# Patient Record
Sex: Female | Born: 1945 | Race: White | Hispanic: No | Marital: Married
Health system: Southern US, Community
[De-identification: ages and names within clinical notes are randomized; demographics above are authoritative.]

---

## 1997-12-29 ENCOUNTER — Other Ambulatory Visit: Admission: RE | Admit: 1997-12-29 | Discharge: 1997-12-29 | Payer: Self-pay | Admitting: Obstetrics and Gynecology

## 1998-09-03 ENCOUNTER — Other Ambulatory Visit: Admission: RE | Admit: 1998-09-03 | Discharge: 1998-09-03 | Payer: Self-pay | Admitting: *Deleted

## 1999-08-23 ENCOUNTER — Encounter: Payer: Self-pay | Admitting: Obstetrics and Gynecology

## 1999-08-23 ENCOUNTER — Encounter: Admission: RE | Admit: 1999-08-23 | Discharge: 1999-08-23 | Payer: Self-pay | Admitting: Obstetrics and Gynecology

## 2000-08-24 ENCOUNTER — Encounter: Admission: RE | Admit: 2000-08-24 | Discharge: 2000-08-24 | Payer: Self-pay | Admitting: Obstetrics and Gynecology

## 2000-08-24 ENCOUNTER — Encounter: Payer: Self-pay | Admitting: Obstetrics and Gynecology

## 2001-08-25 ENCOUNTER — Encounter: Payer: Self-pay | Admitting: Obstetrics and Gynecology

## 2001-08-25 ENCOUNTER — Encounter: Admission: RE | Admit: 2001-08-25 | Discharge: 2001-08-25 | Payer: Self-pay | Admitting: Obstetrics and Gynecology

## 2002-08-26 ENCOUNTER — Encounter: Admission: RE | Admit: 2002-08-26 | Discharge: 2002-08-26 | Payer: Self-pay | Admitting: Obstetrics and Gynecology

## 2002-08-26 ENCOUNTER — Encounter: Payer: Self-pay | Admitting: Obstetrics and Gynecology

## 2003-09-01 ENCOUNTER — Encounter: Admission: RE | Admit: 2003-09-01 | Discharge: 2003-09-01 | Payer: Self-pay | Admitting: Obstetrics and Gynecology

## 2004-01-09 ENCOUNTER — Encounter: Admission: RE | Admit: 2004-01-09 | Discharge: 2004-04-08 | Payer: Self-pay | Admitting: Family Medicine

## 2004-08-14 ENCOUNTER — Encounter: Admission: RE | Admit: 2004-08-14 | Discharge: 2004-08-14 | Payer: Self-pay | Admitting: Family Medicine

## 2004-09-12 ENCOUNTER — Encounter: Admission: RE | Admit: 2004-09-12 | Discharge: 2004-09-12 | Payer: Self-pay | Admitting: Family Medicine

## 2005-09-16 ENCOUNTER — Encounter: Admission: RE | Admit: 2005-09-16 | Discharge: 2005-09-16 | Payer: Self-pay | Admitting: Obstetrics and Gynecology

## 2005-11-06 IMAGING — RF DG UGI W/ HIGH DENSITY W/KUB
12 of 15 series · 12 of 15 positions shown · non-contrast
Comparison: None.

CLINICAL DATA: Chest pain. 
UPPER GI W/KUB:

[Series 1: run · 1 of 1 slices shown (1 of 11)]
[im 1/1]
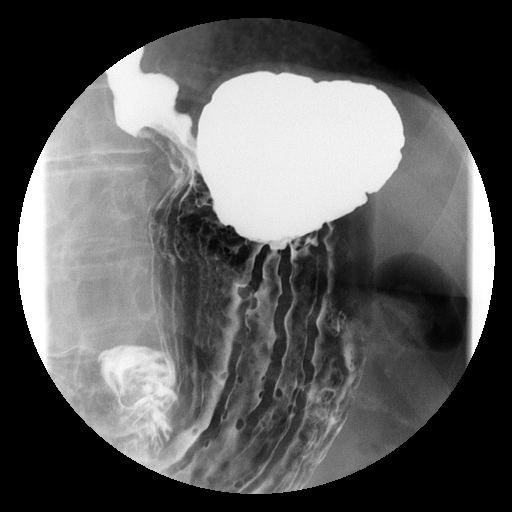

[Series 2: run · 1 of 1 slices shown (2 of 11)]
[im 1/1]
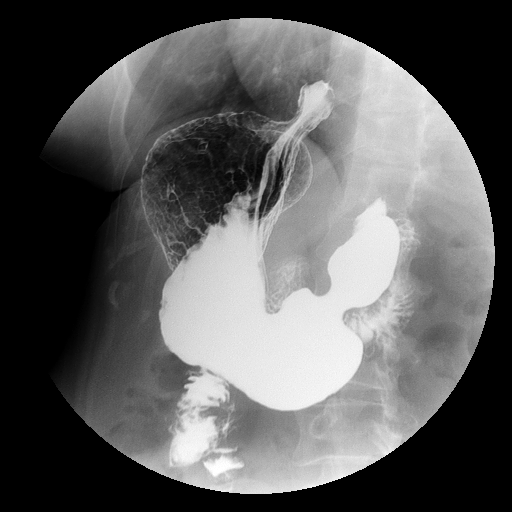

[Series 4: run · 1 of 1 slices shown (3 of 11)]
[im 1/1]
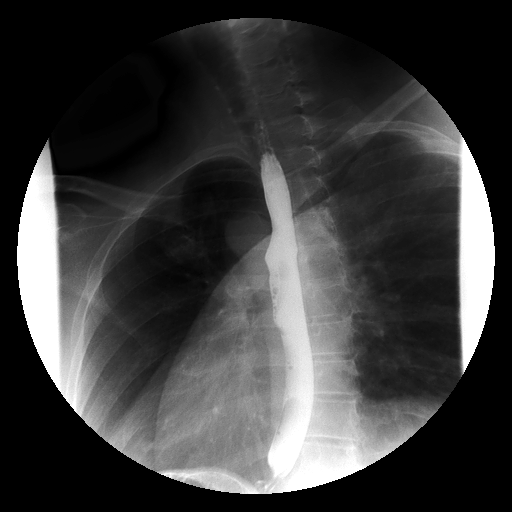

[Series 5: run · 1 of 1 slices shown (4 of 11)]
[im 1/1]
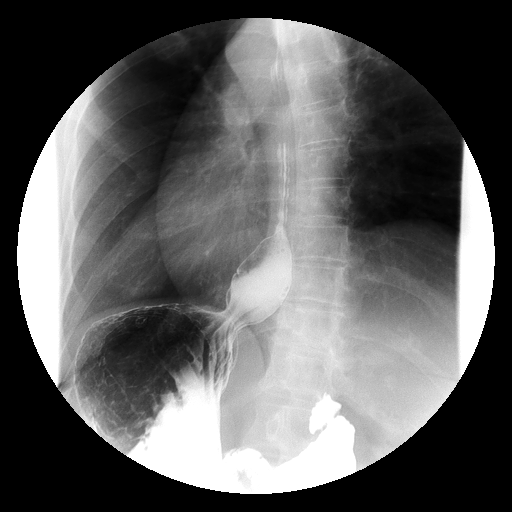

[Series 6: run · 1 of 1 slices shown (5 of 11)]
[im 1/1]
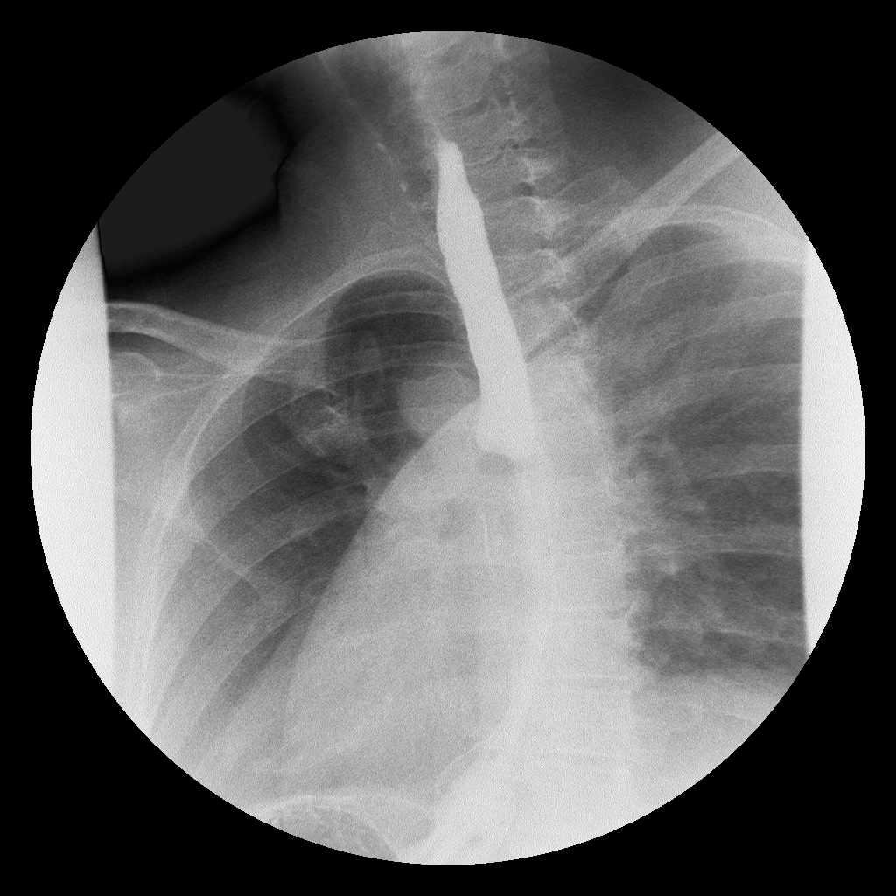

[Series 7: run · 1 of 1 slices shown (6 of 11)]
[im 1/1]
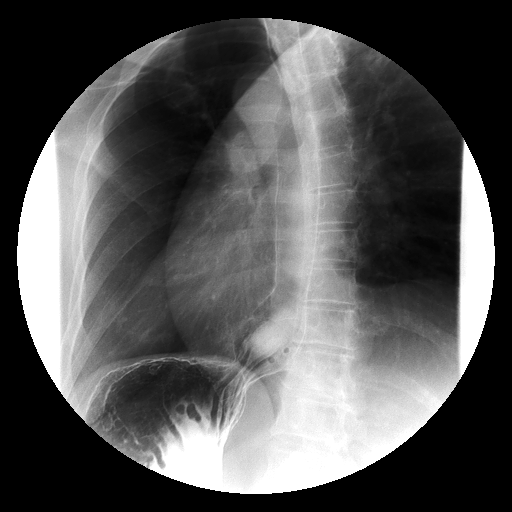

[Series 9: run · 1 of 1 slices shown (7 of 11)]
[im 1/1]
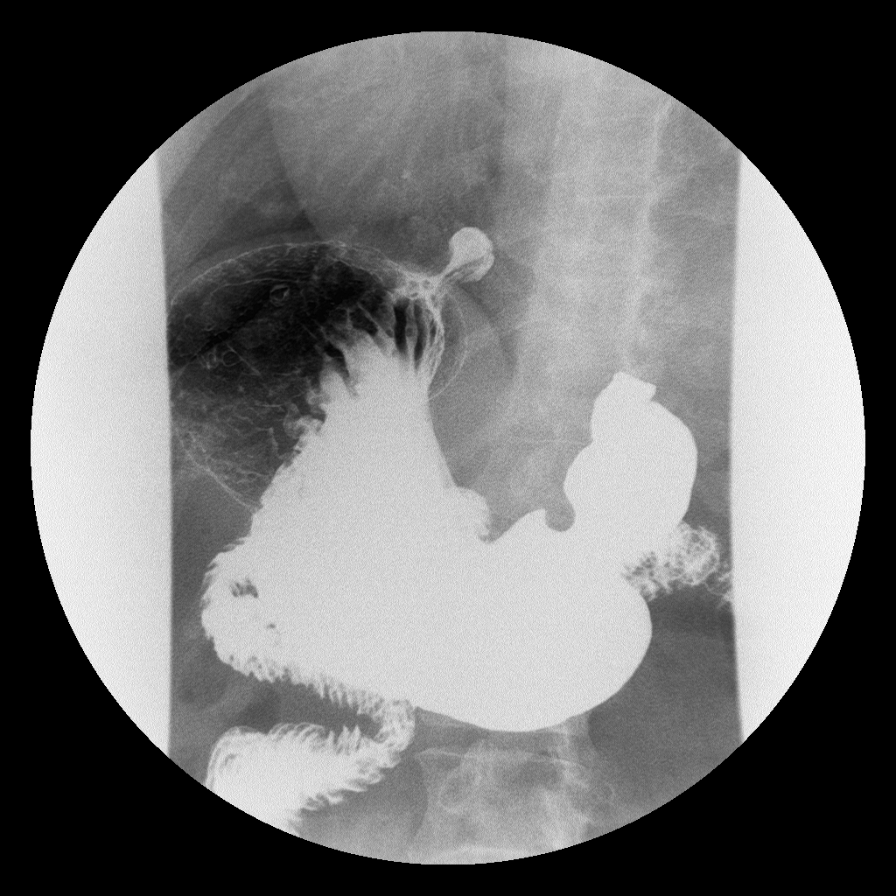

[Series 10: run · 1 of 1 slices shown (8 of 11)]
[im 1/1]
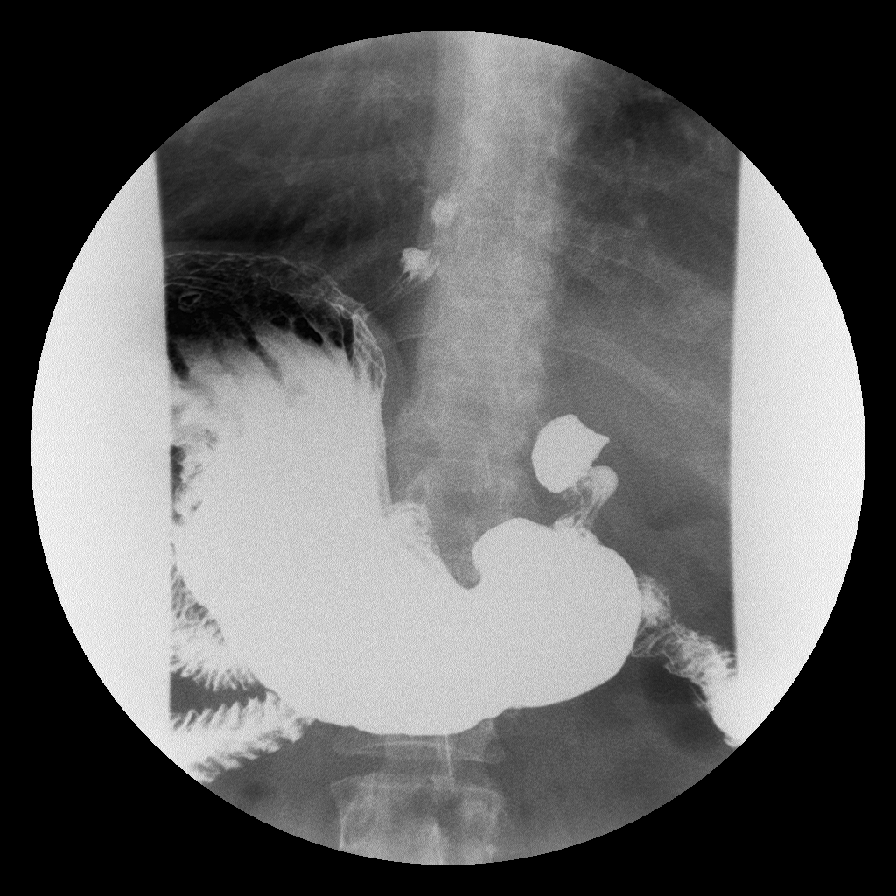

[Series 11: run · 1 of 1 slices shown (9 of 11)]
[im 1/1]
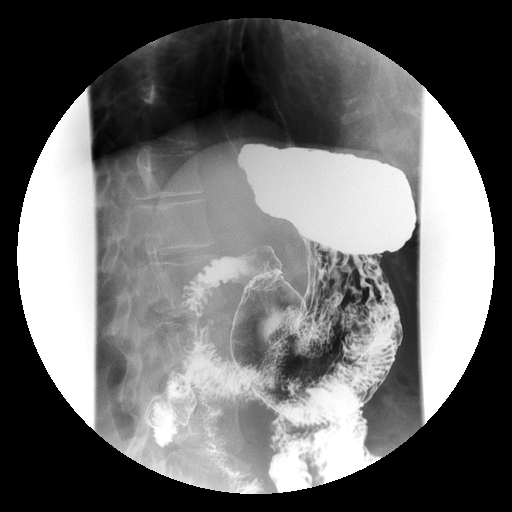

[Series 12: run · 1 of 1 slices shown (10 of 11)]
[im 1/1]
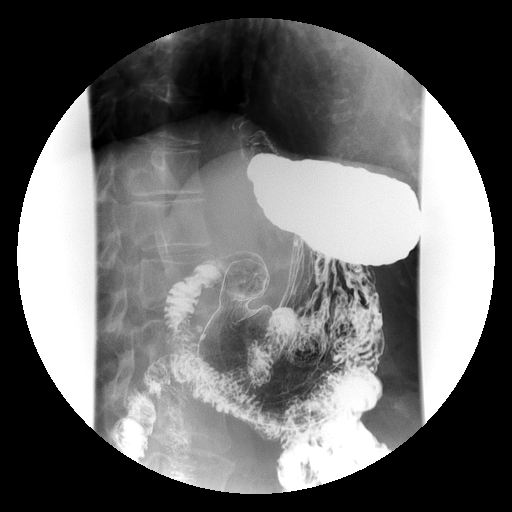

[Series 14: run · 1 of 1 slices shown (11 of 11)]
[im 1/1]
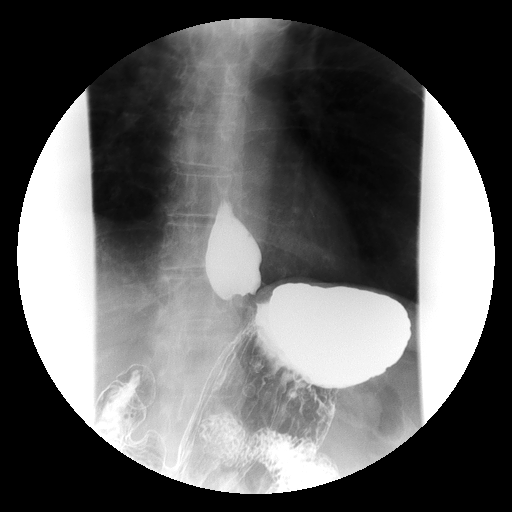

[Series 1001: view not recorded · 0.20mm/px · 1 of 1 slices shown]
[im 1/1]
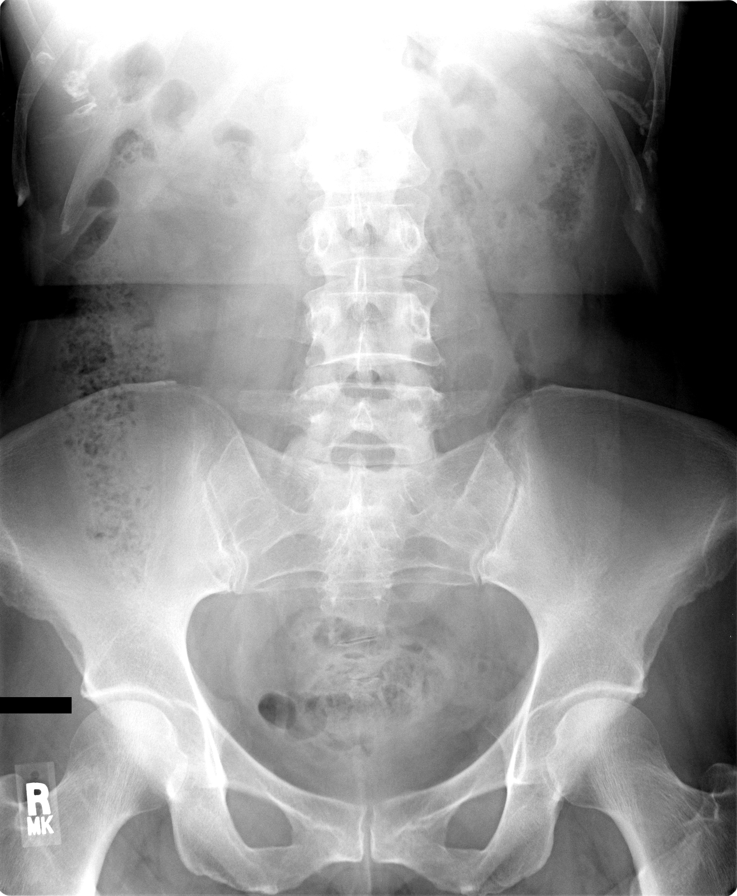

[12 of 15 positions shown; findings below may reference images not displayed]

KUB ? unremarkable. 
Swallowing mechanism is normal.  There is a small sliding hiatal hernia.  No radiographic evidence for stricture or esophagitis.  However, there is intermittent spasm noted at fluoroscopy.  Water siphon maneuver produced a moderate amount of gastroesophageal reflux.  Stomach, duodenal bulb, and proximal small bowel normal.
IMPRESSION: Small sliding hiatal hernia with reflux and intermittent spasm ? no radiographic evidence for stricture or esophagitis. Otherwise normal.

## 2006-09-17 ENCOUNTER — Encounter: Admission: RE | Admit: 2006-09-17 | Discharge: 2006-09-17 | Payer: Self-pay | Admitting: Obstetrics and Gynecology

## 2007-09-20 ENCOUNTER — Encounter: Admission: RE | Admit: 2007-09-20 | Discharge: 2007-09-20 | Payer: Self-pay | Admitting: Obstetrics and Gynecology

## 2007-09-28 ENCOUNTER — Encounter: Admission: RE | Admit: 2007-09-28 | Discharge: 2007-09-28 | Payer: Self-pay | Admitting: Obstetrics and Gynecology

## 2008-09-20 ENCOUNTER — Encounter: Admission: RE | Admit: 2008-09-20 | Discharge: 2008-09-20 | Payer: Self-pay | Admitting: Obstetrics and Gynecology

## 2009-09-21 ENCOUNTER — Encounter: Admission: RE | Admit: 2009-09-21 | Discharge: 2009-09-21 | Payer: Self-pay | Admitting: Obstetrics and Gynecology

## 2010-08-17 ENCOUNTER — Other Ambulatory Visit: Payer: Self-pay | Admitting: Obstetrics and Gynecology

## 2010-08-17 DIAGNOSIS — Z1239 Encounter for other screening for malignant neoplasm of breast: Secondary | ICD-10-CM

## 2010-08-22 ENCOUNTER — Other Ambulatory Visit: Payer: Self-pay

## 2010-08-22 DIAGNOSIS — Z1239 Encounter for other screening for malignant neoplasm of breast: Secondary | ICD-10-CM

## 2010-09-23 ENCOUNTER — Ambulatory Visit
Admission: RE | Admit: 2010-09-23 | Discharge: 2010-09-23 | Disposition: A | Discharge status: Home / Self Care | Payer: BC Managed Care – PPO | Source: Ambulatory Visit | Attending: Obstetrics and Gynecology | Admitting: Obstetrics and Gynecology

## 2010-09-23 DIAGNOSIS — Z1239 Encounter for other screening for malignant neoplasm of breast: Secondary | ICD-10-CM

## 2011-08-28 ENCOUNTER — Other Ambulatory Visit: Payer: Self-pay | Admitting: Family Medicine

## 2011-08-28 DIAGNOSIS — Z1231 Encounter for screening mammogram for malignant neoplasm of breast: Secondary | ICD-10-CM

## 2011-09-26 ENCOUNTER — Ambulatory Visit
Admission: RE | Admit: 2011-09-26 | Discharge: 2011-09-26 | Disposition: A | Discharge status: Home / Self Care | Payer: BC Managed Care – PPO | Source: Ambulatory Visit | Attending: Family Medicine | Admitting: Family Medicine

## 2011-09-26 DIAGNOSIS — Z1231 Encounter for screening mammogram for malignant neoplasm of breast: Secondary | ICD-10-CM

## 2012-09-01 ENCOUNTER — Other Ambulatory Visit: Payer: Self-pay | Admitting: Family Medicine

## 2012-09-01 DIAGNOSIS — Z1231 Encounter for screening mammogram for malignant neoplasm of breast: Secondary | ICD-10-CM

## 2012-10-01 ENCOUNTER — Ambulatory Visit: Payer: BC Managed Care – PPO

## 2012-10-05 ENCOUNTER — Ambulatory Visit (INDEPENDENT_AMBULATORY_CARE_PROVIDER_SITE_OTHER): Payer: Medicare PPO

## 2012-10-05 DIAGNOSIS — Z1231 Encounter for screening mammogram for malignant neoplasm of breast: Secondary | ICD-10-CM

## 2013-09-12 ENCOUNTER — Other Ambulatory Visit: Payer: Self-pay | Admitting: Nurse Practitioner

## 2013-09-12 DIAGNOSIS — Z1231 Encounter for screening mammogram for malignant neoplasm of breast: Secondary | ICD-10-CM

## 2013-10-06 ENCOUNTER — Ambulatory Visit (INDEPENDENT_AMBULATORY_CARE_PROVIDER_SITE_OTHER): Payer: Medicare PPO

## 2013-10-06 DIAGNOSIS — Z1231 Encounter for screening mammogram for malignant neoplasm of breast: Secondary | ICD-10-CM

## 2014-09-12 ENCOUNTER — Other Ambulatory Visit: Payer: Self-pay | Admitting: Family Medicine

## 2014-09-12 DIAGNOSIS — Z1231 Encounter for screening mammogram for malignant neoplasm of breast: Secondary | ICD-10-CM

## 2014-10-11 ENCOUNTER — Ambulatory Visit (INDEPENDENT_AMBULATORY_CARE_PROVIDER_SITE_OTHER): Payer: Medicare PPO

## 2014-10-11 DIAGNOSIS — Z1231 Encounter for screening mammogram for malignant neoplasm of breast: Secondary | ICD-10-CM
